# Patient Record
Sex: Male | Born: 1975 | Race: White | Hispanic: No | Marital: Single | Smoking: Current every day smoker
Health system: Southern US, Community
[De-identification: ages and names within clinical notes are randomized; demographics above are authoritative.]

## PROBLEM LIST (undated history)

## (undated) DIAGNOSIS — F1910 Other psychoactive substance abuse, uncomplicated: Secondary | ICD-10-CM

---

## 1999-02-26 ENCOUNTER — Encounter: Payer: Self-pay | Admitting: Emergency Medicine

## 1999-02-26 ENCOUNTER — Emergency Department (HOSPITAL_COMMUNITY): Admission: EM | Admit: 1999-02-26 | Discharge: 1999-02-26 | Payer: Self-pay | Admitting: Emergency Medicine

## 2001-01-16 ENCOUNTER — Emergency Department (HOSPITAL_COMMUNITY): Admission: EM | Admit: 2001-01-16 | Discharge: 2001-01-16 | Payer: Self-pay | Admitting: Emergency Medicine

## 2002-02-05 ENCOUNTER — Emergency Department (HOSPITAL_COMMUNITY): Admission: EM | Admit: 2002-02-05 | Discharge: 2002-02-06 | Payer: Self-pay | Admitting: Emergency Medicine

## 2002-02-06 ENCOUNTER — Encounter: Payer: Self-pay | Admitting: Emergency Medicine

## 2003-06-22 ENCOUNTER — Emergency Department (HOSPITAL_COMMUNITY): Admission: EM | Admit: 2003-06-22 | Discharge: 2003-06-22 | Payer: Self-pay | Admitting: Emergency Medicine

## 2003-12-16 ENCOUNTER — Emergency Department (HOSPITAL_COMMUNITY): Admission: EM | Admit: 2003-12-16 | Discharge: 2003-12-16 | Payer: Self-pay | Admitting: Emergency Medicine

## 2013-08-17 ENCOUNTER — Emergency Department (HOSPITAL_COMMUNITY): Payer: No Typology Code available for payment source

## 2013-08-17 ENCOUNTER — Emergency Department (HOSPITAL_COMMUNITY)
Admission: EM | Admit: 2013-08-17 | Discharge: 2013-08-17 | Disposition: A | Discharge status: Home / Self Care | Payer: No Typology Code available for payment source | Attending: Emergency Medicine | Admitting: Emergency Medicine

## 2013-08-17 ENCOUNTER — Encounter (HOSPITAL_COMMUNITY): Payer: Self-pay | Admitting: Emergency Medicine

## 2013-08-17 DIAGNOSIS — S161XXA Strain of muscle, fascia and tendon at neck level, initial encounter: Secondary | ICD-10-CM

## 2013-08-17 DIAGNOSIS — S298XXA Other specified injuries of thorax, initial encounter: Secondary | ICD-10-CM | POA: Insufficient documentation

## 2013-08-17 DIAGNOSIS — Y9389 Activity, other specified: Secondary | ICD-10-CM | POA: Insufficient documentation

## 2013-08-17 DIAGNOSIS — Y92410 Unspecified street and highway as the place of occurrence of the external cause: Secondary | ICD-10-CM | POA: Insufficient documentation

## 2013-08-17 DIAGNOSIS — S8000XA Contusion of unspecified knee, initial encounter: Secondary | ICD-10-CM | POA: Insufficient documentation

## 2013-08-17 DIAGNOSIS — IMO0002 Reserved for concepts with insufficient information to code with codable children: Secondary | ICD-10-CM | POA: Insufficient documentation

## 2013-08-17 DIAGNOSIS — F1010 Alcohol abuse, uncomplicated: Secondary | ICD-10-CM | POA: Insufficient documentation

## 2013-08-17 DIAGNOSIS — S139XXA Sprain of joints and ligaments of unspecified parts of neck, initial encounter: Secondary | ICD-10-CM | POA: Insufficient documentation

## 2013-08-17 DIAGNOSIS — S6000XA Contusion of unspecified finger without damage to nail, initial encounter: Secondary | ICD-10-CM | POA: Insufficient documentation

## 2013-08-17 DIAGNOSIS — S3981XA Other specified injuries of abdomen, initial encounter: Secondary | ICD-10-CM | POA: Insufficient documentation

## 2013-08-17 DIAGNOSIS — R4789 Other speech disturbances: Secondary | ICD-10-CM | POA: Insufficient documentation

## 2013-08-17 MED ORDER — IOHEXOL 300 MG/ML  SOLN
100.0000 mL | Freq: Once | INTRAMUSCULAR | Status: AC | PRN
  Administered 2013-08-17: 100 mL via INTRAVENOUS

## 2013-08-17 MED ORDER — IBUPROFEN 600 MG PO TABS: 600.0000 mg | ORAL_TABLET | Freq: Four times a day (QID) | ORAL | Status: AC | PRN

## 2013-08-17 NOTE — ED Notes (Signed)
Patient transported to X-ray accompanied by [facility]

## 2013-08-17 NOTE — Discharge Instructions (Signed)
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 call [phone] to request a pediatric application.  Dental services are provided in all areas of dental care including fillings, crowns and bridges, complete  and partial dentures, implants, gum treatment, root canals, and extractions. Preventive care is also provided. Treatment is provided to both adults and children. Patients are selected via a lottery and there is often a waiting list.   [facility] [address], [place]  [phone] [email or web address]   [facility] [address], [place], [place] [phone], Ext. [phone] Second and Fourth Thursday of each month, opens at 6:30 AM; Clinic ends at 9 AM.  Patients are seen on a first-come first-served basis, and a limited number are seen during each clinic.   [facility]  [address], [place], [place] [phone]   Eligibility Requirements You must have lived in [place], [place], or [place] counties for at least the last three months.   You cannot be eligible for state or federal sponsored [place]healthcare insurance, including [employer], [place], or [employer]icare.   You generally cannot be eligible for healthcare insurance through your employer.    How to apply: Eligibility screenings are held every Tuesday and Wednesday afternoon from 1:00 pm until 4:00 pm. You do not need an appointment for the interview!  [facility] [address], [place], [place] [phone]   [facility]  [phone]   [facility]  [phone]   [facility]  [phone]    Behavioral Health Resources in the Community: Intensive Outpatient Programs Organization         Address  Phone  Notes  [facility] [address], [place], [place] [phone]   [facility] [address], [place], [place] [phone]   [facility] [address], [place], [place]  [phone]   [facility] [address],  [place], [place] [phone] or [phone]   Substance Abuse Resources Organization          Address  Phone  Notes  [facility]  [phone]   [facility]  [phone]   [facility]  [phone]   [facility]  [phone]   Residential & Outpatient Substance Abuse Program  [phone]   Psychological Services Organization         Address  Phone  Notes  [facility]  [phone]- [phone]   [facility]  [phone]- [phone]   [facility] [address], [place] [phone] or [phone]    Mobile Crisis Teams Organization         Address  Phone  Notes  [facility], Mobile Crisis Care Unit  [phone]   [facility]  [address] [place], [place] [phone]   Doristine LocksSharon DeEsch [address] [place] [phone]    Self-Help/Support Groups Organization         Address  Phone             Notes  [facility] [place] - variety of support groups  [phone] Call for more information  Narcotics Anonymous (NA), [facility] [address], [place]  2 meetings at this location   Residential Treatment Programs Organization         Address  Phone  Notes  [facility] [address],    [place]  [phone]   [facility]  [address] [place] [address], [place], [place] [phone]   [facility] [address] [place], [place] [phone] Admissions: 8am-3pm M-F  [facility] [address]    [place], [place] [phone]   [facility] [address] [place], [place], [place] [phone]   [facility] [address],  [place], [place] [phone]   [facility] - Intensive Outpatient [address], [place], [place] [phone]   [facility] ([facility] [address]  [place], [place] [phone] or [phone]   [facility] ([facility]) [address], [place], [place]  [phone] Accepts Medicaid  [place] [address]  [place] [phone] Substance Abuse/Addiction Treatment   [facility] Organization         Address  Phone  Notes  [facility]  [phone]   Angie Fava, PhD [address] [place] [place], [place]   [phone] or [phone]   [facility]   [address] [place], [place] [phone]   [facility] [address] [place], [place] [phone] Insurance/Medicaid/sponsorship through [facility] and Families [address]                                    [place], [place] [phone] Therapy/tele-psych/case  [facility] [address], [place] [phone]    Dr. Lolly Mustache  [phone]   [facility]  [employer] [facility] 1) [address]  2) [address], [place] 3)  [address], [place] [phone]  [phone]   [facility] Child Abuse Hotline [phone] or [phone] (After Hours)
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
## 2020-01-24 ENCOUNTER — Other Ambulatory Visit: Payer: Self-pay

## 2020-01-24 ENCOUNTER — Encounter (HOSPITAL_COMMUNITY): Payer: Self-pay

## 2020-01-24 DIAGNOSIS — F17200 Nicotine dependence, unspecified, uncomplicated: Secondary | ICD-10-CM | POA: Insufficient documentation

## 2020-01-24 DIAGNOSIS — F1410 Cocaine abuse, uncomplicated: Secondary | ICD-10-CM | POA: Insufficient documentation

## 2020-01-24 NOTE — ED Triage Notes (Signed)
Pt sts he used crack earlier and police brought him here. Requesting detox. Last use 1 hour ago.

## 2020-01-25 ENCOUNTER — Encounter (HOSPITAL_COMMUNITY): Payer: Self-pay | Admitting: *Deleted

## 2020-01-25 ENCOUNTER — Other Ambulatory Visit: Payer: Self-pay

## 2020-01-25 ENCOUNTER — Emergency Department (HOSPITAL_COMMUNITY)
Admission: EM | Admit: 2020-01-25 | Discharge: 2020-01-25 | Disposition: A | Discharge status: Home / Self Care | Payer: Self-pay | Attending: Emergency Medicine | Admitting: Emergency Medicine

## 2020-01-25 DIAGNOSIS — F1910 Other psychoactive substance abuse, uncomplicated: Secondary | ICD-10-CM

## 2020-01-25 DIAGNOSIS — F17200 Nicotine dependence, unspecified, uncomplicated: Secondary | ICD-10-CM | POA: Insufficient documentation

## 2020-01-25 HISTORY — DX: Other psychoactive substance abuse, uncomplicated: F19.10

## 2020-01-25 LAB — COMPREHENSIVE METABOLIC PANEL
ALT: 14 U/L (ref 0–44)
AST: 17 U/L (ref 15–41)
Albumin: 4.3 g/dL (ref 3.5–5.0)
Alkaline Phosphatase: 65 U/L (ref 38–126)
Anion gap: 10 (ref 5–15)
BUN: 10 mg/dL (ref 6–20)
CO2: 25 mmol/L (ref 22–32)
Calcium: 9.2 mg/dL (ref 8.9–10.3)
Chloride: 102 mmol/L (ref 98–111)
Creatinine, Ser: 0.87 mg/dL (ref 0.61–1.24)
GFR calc Af Amer: >60 mL/min (ref >60)
GFR calc non Af Amer: >60 mL/min (ref >60)
Glucose, Bld: 117 mg/dL — ABNORMAL HIGH (ref 70–99)
Potassium: 3.7 mmol/L (ref 3.5–5.1)
Sodium: 137 mmol/L (ref 135–145)
Total Bilirubin: 0.7 mg/dL (ref 0.3–1.2)
Total Protein: 7.6 g/dL (ref 6.5–8.1)

## 2020-01-25 LAB — CBC
HCT: 46.2 % (ref 39.0–52.0)
Hemoglobin: 16.1 g/dL (ref 13.0–17.0)
MCH: 30.1 pg (ref 26.0–34.0)
MCHC: 34.8 g/dL (ref 30.0–36.0)
MCV: 86.4 fL (ref 80.0–100.0)
Platelets: 344 10*3/uL (ref 150–400)
RBC: 5.35 MIL/uL (ref 4.22–5.81)
RDW: 13.1 % (ref 11.5–15.5)
WBC: 10.4 10*3/uL (ref 4.0–10.5)
nRBC: 0 % (ref 0.0–0.2)

## 2020-01-25 LAB — RAPID URINE DRUG SCREEN, HOSP PERFORMED
Amphetamines: NOT DETECTED
Barbiturates: NOT DETECTED
Benzodiazepines: NOT DETECTED
Cocaine: POSITIVE — AB
Opiates: NOT DETECTED
Tetrahydrocannabinol: POSITIVE — AB

## 2020-01-25 LAB — ETHANOL: Alcohol, Ethyl (B): <10 mg/dL (ref <10)

## 2020-01-25 NOTE — ED Triage Notes (Signed)
Pt states he is to go to [facility] [place] if he can get his blood work and assessment. He has been told to call and get specific location and fax number. He was seen last night about 1037 with workup for detox.

## 2020-01-25 NOTE — Discharge Instructions (Signed)
I have reached out to the facility that you have requested.  Currently you are not qualify to have inpatient rehab for cocaine and marijuana abuse at this facility.  Please use resources below to contact other facility for help with detox. Return if you have any concerns.

## 2020-01-25 NOTE — ED Provider Notes (Signed)
[facility]-EMERGENCY DEPT Provider Note   CSN: [id number] Arrival date & time: 01/24/20  2149     History Chief Complaint  Patient presents with   Drug / Alcohol Assessment    Francisco Hall is a 44 y.o. male.  Patient presents to the emergency department with a chief complaint of polysubstance abuse.  He states that he would like to stop using cocaine.  He denies any SI or HI.  He has never been in rehab before.  He does not know how the process works.  He is requesting help in regard to this.  He denies any recent illnesses.  Denies any other associated symptoms or problems tonight.  The history is provided by the patient. No language interpreter was used.       History reviewed. No pertinent past medical history.  There are no problems to display for this patient.   History reviewed. No pertinent surgical history.     No family history on file.  Social History   Tobacco Use   Smoking status: Current Every Day Smoker   Smokeless tobacco: Never Used  Substance Use Topics   Alcohol use: Yes   Drug use: Yes    Comment: crack    Home Medications Prior to Admission medications   Medication Sig Start Date End Date Taking? Authorizing Provider  ibuprofen (ADVIL,MOTRIN) 600 MG tablet Take 1 tablet (600 mg total) by mouth every 6 (six) hours as needed. 08/17/13   Blake Divine, MD    Allergies    Patient has no known allergies.  Review of Systems   Review of Systems  All other systems reviewed and are negative.   Physical Exam Updated Vital Signs BP 106/70    Pulse 89    Temp 97.8 F (36.6 C) (Oral)    Resp 18    Ht 5\' 9"  (1.753 m)    Wt 74.8 kg    SpO2 98%    BMI 24.37 kg/m   Physical Exam Vitals and nursing note reviewed.  Constitutional:      General: He is not in acute distress.    Appearance: He is well-developed. He is not ill-appearing.  HENT:     Head: Normocephalic and atraumatic.  Eyes:     Conjunctiva/sclera:  Conjunctivae normal.  Cardiovascular:     Rate and Rhythm: Normal rate.  Pulmonary:     Effort: Pulmonary effort is normal. No respiratory distress.  Abdominal:     General: There is no distension.  Musculoskeletal:     Cervical back: Neck supple.     Comments: Moves all extremities  Skin:    General: Skin is warm and dry.  Neurological:     Mental Status: He is alert and oriented to person, place, and time.  Psychiatric:        Mood and Affect: Mood normal.        Behavior: Behavior normal.     ED Results / Procedures / Treatments   Labs (all labs ordered are listed, but only abnormal results are displayed) Labs Reviewed  COMPREHENSIVE METABOLIC PANEL - Abnormal; Notable for the following components:      Result Value   Glucose, Bld 117 (*)    All other components within normal limits  RAPID URINE DRUG SCREEN, HOSP PERFORMED - Abnormal; Notable for the following components:   Cocaine POSITIVE (*)    Tetrahydrocannabinol POSITIVE (*)    All other components within normal limits  ETHANOL  CBC  EKG None  Radiology No results found.  Procedures Procedures (including critical care time)  Medications Ordered in ED Medications - No data to display  ED Course  I have reviewed the triage vital signs and the nursing notes.  Pertinent labs & imaging results that were available during my care of the patient were reviewed by me and considered in my medical decision making (see chart for details).    MDM Rules/Calculators/A&P                          Patient here requesting detox from cocaine.  Informed patient that I can provide him with resources.  He is agreeable with this plan.  Laboratory work-up is reassuring.  Vital signs are stable.  Patient appears stable and nontoxic in appearance.  Final Clinical Impression(s) / ED Diagnoses Final diagnoses:  Polysubstance abuse [facility])    Rx / DC Orders ED Discharge Orders    None       Roxy Horseman,  PA-C 01/25/20 0523    Palumbo, April, MD 01/25/20 0530

## 2020-01-25 NOTE — ED Provider Notes (Signed)
[facility]-EMERGENCY DEPT Provider Note   CSN: [id number] Arrival date & time: 01/25/20  1013     History Chief Complaint  Patient presents with  . Detox    Francisco Hall is a 44 y.o. male.  The history is provided by the patient and medical records. No language interpreter was used.     44 year old male who presents ED last night with chief complaints of polysubstance abuse.  Patient admits that he would like to quit using cocaine but that he has never been through rehab before.  At time he denies any SI HI and no other complaints.  Patient was screened last night and was deemed to be medically cleared.  He was given  resource for outpatient rehab.  Patient states he contacted placed but never to request for a bed, however he was recommended to have a medical clearance labs as well as recommendation and progress note faxed to the facility.  He is here requesting for the appropriate paperwork.  Past Medical History:  Diagnosis Date  . Substance abuse (HCC)     There are no problems to display for this patient.   History reviewed. No pertinent surgical history.     No family history on file.  Social History   Tobacco Use  . Smoking status: Current Every Day Smoker  . Smokeless tobacco: Never Used  Substance Use Topics  . Alcohol use: Yes  . Drug use: Yes    Types: Cocaine, Marijuana    Comment: crack    Home Medications Prior to Admission medications   Medication Sig Start Date End Date Taking? Authorizing Provider  ibuprofen (ADVIL,MOTRIN) 600 MG tablet Take 1 tablet (600 mg total) by mouth every 6 (six) hours as needed. 08/17/13   Blake Divine, MD    Allergies    Patient has no known allergies.  Review of Systems   Review of Systems  All other systems reviewed and are negative.   Physical Exam Updated Vital Signs BP 108/63 (BP Location: Left Arm)   Pulse 81   Temp 98.2 F (36.8 C) (Oral)   Resp 15   Ht 5\' 9"  (1.753 m)   Wt  74.8 kg   SpO2 96%   BMI 24.37 kg/m   Physical Exam Vitals and nursing note reviewed.  Constitutional:      General: He is not in acute distress.    Appearance: He is well-developed.  HENT:     Head: Atraumatic.  Eyes:     Conjunctiva/sclera: Conjunctivae normal.  Cardiovascular:     Rate and Rhythm: Normal rate and regular rhythm.  Pulmonary:     Effort: Pulmonary effort is normal.     Breath sounds: Normal breath sounds.  Abdominal:     Palpations: Abdomen is soft.     Tenderness: There is no abdominal tenderness.  Musculoskeletal:     Cervical back: Neck supple.  Skin:    Findings: No rash.  Neurological:     Mental Status: He is alert.  Psychiatric:        Mood and Affect: Mood normal.        Speech: Speech normal.        Behavior: Behavior is cooperative.        Thought Content: Thought content does not include homicidal or suicidal ideation.     ED Results / Procedures / Treatments   Labs (all labs ordered are listed, but only abnormal results are displayed) Labs Reviewed - No data to display  EKG None  Radiology No results found.  Procedures Procedures (including critical care time)  Medications Ordered in ED Medications - No data to display  ED Course  I have reviewed the triage vital signs and the nursing notes.  Pertinent labs & imaging results that were available during my care of the patient were reviewed by me and considered in my medical decision making (see chart for details).    MDM Rules/Calculators/A&P                          BP 108/63 (BP Location: Left Arm)   Pulse 81   Temp 98.2 F (36.8 C) (Oral)   Resp 15   Ht 5\' 9"  (1.753 m)   Wt 74.8 kg   SpO2 96%   BMI 24.37 kg/m   Final Clinical Impression(s) / ED Diagnoses Final diagnoses:  Polysubstance abuse (HCC)    Rx / DC Orders ED Discharge Orders    None     12:16 PM Patient requesting for help with detox from cocaine and marijuana.  He was seen yesterday for same  and did have screening labs obtained as well as resources.  He is here requesting to have his information faxed to a facility in bed in order for him to obtain a bed.  I have reached out and contact the facility.  The facility mentioned that he is not qualify for detox for marijuana and cocaine.  I provided pt with a list of resources to seek outpt help.  Pt otherwise stable for discharge.    , PA-C 01/25/20 1221    03/27/20, MD 01/25/20 [phone]

## 2020-01-25 NOTE — Discharge Instructions (Signed)
We do not offer inpatient placement for substance abuse from the ER.  Please contact the numbers listed for more information regarding treatment.
